# Patient Record
Sex: Female | Born: 1940
Health system: Southern US, Community
[De-identification: ages and names within clinical notes are randomized; demographics above are authoritative.]

## PROBLEM LIST (undated history)

## (undated) DIAGNOSIS — F419 Anxiety disorder, unspecified: Secondary | ICD-10-CM

## (undated) DIAGNOSIS — E871 Hypo-osmolality and hyponatremia: Secondary | ICD-10-CM

## (undated) DIAGNOSIS — F329 Major depressive disorder, single episode, unspecified: Secondary | ICD-10-CM

## (undated) DIAGNOSIS — J449 Chronic obstructive pulmonary disease, unspecified: Secondary | ICD-10-CM

## (undated) DIAGNOSIS — K219 Gastro-esophageal reflux disease without esophagitis: Secondary | ICD-10-CM

## (undated) DIAGNOSIS — I509 Heart failure, unspecified: Secondary | ICD-10-CM

## (undated) DIAGNOSIS — I2510 Atherosclerotic heart disease of native coronary artery without angina pectoris: Secondary | ICD-10-CM

## (undated) DIAGNOSIS — I10 Essential (primary) hypertension: Secondary | ICD-10-CM

## (undated) DIAGNOSIS — D72825 Bandemia: Secondary | ICD-10-CM

## (undated) DIAGNOSIS — F32A Depression, unspecified: Secondary | ICD-10-CM

## (undated) DIAGNOSIS — M1990 Unspecified osteoarthritis, unspecified site: Secondary | ICD-10-CM

## (undated) DIAGNOSIS — I4891 Unspecified atrial fibrillation: Secondary | ICD-10-CM

## (undated) HISTORY — PX: BREAST SURGERY: SHX581

## (undated) HISTORY — PX: CARDIAC ELECTROPHYSIOLOGY MAPPING AND ABLATION: SHX1292

## (undated) HISTORY — PX: FRACTURE SURGERY: SHX138

---

## 2014-02-03 DIAGNOSIS — I10 Essential (primary) hypertension: Secondary | ICD-10-CM | POA: Insufficient documentation

## 2014-02-03 DIAGNOSIS — I341 Nonrheumatic mitral (valve) prolapse: Secondary | ICD-10-CM | POA: Insufficient documentation

## 2015-05-02 DIAGNOSIS — J449 Chronic obstructive pulmonary disease, unspecified: Secondary | ICD-10-CM | POA: Insufficient documentation

## 2016-07-14 DIAGNOSIS — J449 Chronic obstructive pulmonary disease, unspecified: Secondary | ICD-10-CM | POA: Insufficient documentation

## 2017-01-25 DIAGNOSIS — I4892 Unspecified atrial flutter: Secondary | ICD-10-CM

## 2017-01-25 DIAGNOSIS — I4891 Unspecified atrial fibrillation: Secondary | ICD-10-CM | POA: Insufficient documentation

## 2017-02-08 ENCOUNTER — Emergency Department (HOSPITAL_BASED_OUTPATIENT_CLINIC_OR_DEPARTMENT_OTHER)
Admission: EM | Admit: 2017-02-08 | Discharge: 2017-02-08 | Disposition: A | Discharge status: Home / Self Care | Payer: Medicare Other | Attending: Emergency Medicine | Admitting: Emergency Medicine

## 2017-02-08 ENCOUNTER — Emergency Department (HOSPITAL_BASED_OUTPATIENT_CLINIC_OR_DEPARTMENT_OTHER): Payer: Medicare Other

## 2017-02-08 ENCOUNTER — Encounter (HOSPITAL_BASED_OUTPATIENT_CLINIC_OR_DEPARTMENT_OTHER): Payer: Self-pay | Admitting: *Deleted

## 2017-02-08 DIAGNOSIS — Z79899 Other long term (current) drug therapy: Secondary | ICD-10-CM | POA: Insufficient documentation

## 2017-02-08 DIAGNOSIS — Z87891 Personal history of nicotine dependence: Secondary | ICD-10-CM | POA: Insufficient documentation

## 2017-02-08 DIAGNOSIS — R0602 Shortness of breath: Secondary | ICD-10-CM | POA: Diagnosis not present

## 2017-02-08 DIAGNOSIS — Z7901 Long term (current) use of anticoagulants: Secondary | ICD-10-CM | POA: Diagnosis not present

## 2017-02-08 DIAGNOSIS — J90 Pleural effusion, not elsewhere classified: Secondary | ICD-10-CM | POA: Diagnosis not present

## 2017-02-08 DIAGNOSIS — J449 Chronic obstructive pulmonary disease, unspecified: Secondary | ICD-10-CM | POA: Diagnosis not present

## 2017-02-08 DIAGNOSIS — I110 Hypertensive heart disease with heart failure: Secondary | ICD-10-CM | POA: Diagnosis not present

## 2017-02-08 DIAGNOSIS — I509 Heart failure, unspecified: Secondary | ICD-10-CM | POA: Insufficient documentation

## 2017-02-08 HISTORY — DX: Major depressive disorder, single episode, unspecified: F32.9

## 2017-02-08 HISTORY — DX: Hypo-osmolality and hyponatremia: E87.1

## 2017-02-08 HISTORY — DX: Heart failure, unspecified: I50.9

## 2017-02-08 HISTORY — DX: Essential (primary) hypertension: I10

## 2017-02-08 HISTORY — DX: Depression, unspecified: F32.A

## 2017-02-08 HISTORY — DX: Gastro-esophageal reflux disease without esophagitis: K21.9

## 2017-02-08 HISTORY — DX: Chronic obstructive pulmonary disease, unspecified: J44.9

## 2017-02-08 HISTORY — DX: Bandemia: D72.825

## 2017-02-08 HISTORY — DX: Unspecified osteoarthritis, unspecified site: M19.90

## 2017-02-08 HISTORY — DX: Atherosclerotic heart disease of native coronary artery without angina pectoris: I25.10

## 2017-02-08 HISTORY — DX: Anxiety disorder, unspecified: F41.9

## 2017-02-08 HISTORY — DX: Unspecified atrial fibrillation: I48.91

## 2017-02-08 LAB — BASIC METABOLIC PANEL
Anion gap: 9 (ref 5–15)
BUN: 11 mg/dL (ref 6–20)
CALCIUM: 9.3 mg/dL (ref 8.9–10.3)
CHLORIDE: 97 mmol/L — AB (ref 101–111)
CO2: 24 mmol/L (ref 22–32)
CREATININE: 0.82 mg/dL (ref 0.44–1.00)
GFR calc non Af Amer: >60 mL/min (ref >60)
Glucose, Bld: 117 mg/dL — ABNORMAL HIGH (ref 65–99)
Potassium: 3.5 mmol/L (ref 3.5–5.1)
Sodium: 130 mmol/L — ABNORMAL LOW (ref 135–145)

## 2017-02-08 LAB — CBC
HCT: 38.5 % (ref 36.0–46.0)
Hemoglobin: 12.8 g/dL (ref 12.0–15.0)
MCH: 31.1 pg (ref 26.0–34.0)
MCHC: 33.2 g/dL (ref 30.0–36.0)
MCV: 93.7 fL (ref 78.0–100.0)
PLATELETS: 398 10*3/uL (ref 150–400)
RBC: 4.11 MIL/uL (ref 3.87–5.11)
RDW: 13.2 % (ref 11.5–15.5)
WBC: 14.2 10*3/uL — AB (ref 4.0–10.5)

## 2017-02-08 LAB — URINALYSIS, ROUTINE W REFLEX MICROSCOPIC
Glucose, UA: NEGATIVE mg/dL
HGB URINE DIPSTICK: NEGATIVE
KETONES UR: 15 mg/dL — AB
Leukocytes, UA: NEGATIVE
Nitrite: NEGATIVE
PH: 6 (ref 5.0–8.0)
Protein, ur: NEGATIVE mg/dL
SPECIFIC GRAVITY, URINE: 1.025 (ref 1.005–1.030)

## 2017-02-08 LAB — TROPONIN I
Troponin I: 0.06 ng/mL (ref <0.03)
Troponin I: 0.05 ng/mL (ref <0.03)

## 2017-02-08 LAB — BRAIN NATRIURETIC PEPTIDE: B NATRIURETIC PEPTIDE 5: 231.1 pg/mL — AB (ref 0.0–100.0)

## 2017-02-08 MED ORDER — FUROSEMIDE 20 MG PO TABS: 20.0000 mg | ORAL_TABLET | Freq: Once | ORAL | 0 refills | Status: DC

## 2017-02-08 MED ORDER — IPRATROPIUM-ALBUTEROL 0.5-2.5 (3) MG/3ML IN SOLN
3.0000 mL | Freq: Four times a day (QID) | RESPIRATORY_TRACT | Status: DC
  Administered 2017-02-08: 3 mL via RESPIRATORY_TRACT

## 2017-02-08 MED ORDER — SODIUM BICARBONATE 325 MG PO TABS: 325.0000 mg | ORAL_TABLET | Freq: Two times a day (BID) | ORAL | 0 refills | Status: AC

## 2017-02-08 MED ORDER — IPRATROPIUM-ALBUTEROL 0.5-2.5 (3) MG/3ML IN SOLN
RESPIRATORY_TRACT | Status: AC
  Administered 2017-02-08: 3 mL via RESPIRATORY_TRACT
  Filled 2017-02-08: qty 3

## 2017-02-08 MED ORDER — POTASSIUM CHLORIDE ER 10 MEQ PO TBCR: 20.0000 meq | EXTENDED_RELEASE_TABLET | Freq: Two times a day (BID) | ORAL | 0 refills | Status: DC

## 2017-02-08 MED ORDER — IOPAMIDOL (ISOVUE-370) INJECTION 76%
100.0000 mL | Freq: Once | INTRAVENOUS | Status: AC | PRN
  Administered 2017-02-08: 100 mL via INTRAVENOUS

## 2017-02-08 MED ORDER — HYDROXYZINE HCL 25 MG PO TABS: 25.0000 mg | ORAL_TABLET | Freq: Four times a day (QID) | ORAL | 0 refills | Status: AC | PRN

## 2017-02-08 MED ORDER — FUROSEMIDE 20 MG PO TABS
20.0000 mg | ORAL_TABLET | Freq: Once | ORAL | Status: AC
  Administered 2017-02-08: 20 mg via ORAL
  Filled 2017-02-08: qty 1

## 2017-02-08 MED ORDER — DEXAMETHASONE 6 MG PO TABS
10.0000 mg | ORAL_TABLET | Freq: Once | ORAL | Status: AC
  Administered 2017-02-08: 10 mg via ORAL
  Filled 2017-02-08: qty 1

## 2017-02-08 MED FILL — hydrOXYzine HCL 25 MG TABS: 25 | 5 days supply | Qty: 20 | Fill #0

## 2017-02-08 MED FILL — POTASSIUM CL ER 10 MEQ TAB: 10 | 1 days supply | Qty: 4 | Fill #0

## 2017-02-08 MED FILL — FUROSEMIDE 20 MG TAB: 20 | 1 days supply | Qty: 1 | Fill #0

## 2017-02-08 NOTE — ED Notes (Signed)
Date and time results received: 02/08/17 3:42 PM   Test: Troponin Critical Value: 0.05  Name of Provider Notified: Schlossman  Orders Received? Or Actions Taken?: Actions Taken: MD notified

## 2017-02-08 NOTE — ED Notes (Signed)
ED Provider at bedside discussing test results and plan of care. 

## 2017-02-08 NOTE — ED Triage Notes (Signed)
Pt states SOB x 4 days, recent admit to [facility] for same , discharge x 1 day ago

## 2017-02-08 NOTE — ED Notes (Signed)
Patient transported to X-ray 

## 2017-02-08 NOTE — ED Notes (Signed)
Back from X-ray - no changes to condition at this time

## 2017-02-08 NOTE — ED Provider Notes (Signed)
[facility] EMERGENCY DEPARTMENT Provider Note   CSN: [id number] Arrival date & time: 02/08/17  1238     History   Chief Complaint Chief Complaint  Patient presents with  . Shortness of Breath    HPI Nicole Kerr is a 76 y.o. female.  HPI  Coming in today for shortness of breath Reports having afib, high rate, development of dyspnea prior to ablation After ablation felt well, had 4 days of being in rhythm and then went back into atrial flutter again  Reports had dyspnea since April or May prior to the ablation, but was not like this  Reports dyspnea worsened after she went back into afib after the ablation, has been about the same as it was during the hospitalization.  Reports now the shortness of breath makes her feel wiped out.  Reports generalized weakness Has had cough from irritated larynx and sore throat from intubation for ablation  NO fevers, no chest pain, nausea, vomiting, diarrhea (had diarrhea previously now improved) Nonproductive cough, hacking cough  Nothing makes dyspnea better, when more active shortness of breath is worse, not really wheezing, dyspnea worse laying down flat, no leg swelling or pain  Just left hospital Saturday, saw Cardiologist while in the hospital  Past Medical History:  Diagnosis Date  . A-fib (HCC)   . Anxiety   . Arthritis   . Bandemia   . CHF (congestive heart failure) (HCC)   . COPD (chronic obstructive pulmonary disease) (HCC)   . Coronary artery disease   . Depression   . GERD (gastroesophageal reflux disease)   . Hypertension   . Hyponatremia     There are no active problems to display for this patient.   Past Surgical History:  Procedure Laterality Date  . BREAST SURGERY    . CARDIAC ELECTROPHYSIOLOGY MAPPING AND ABLATION    . FRACTURE SURGERY      OB History    No data available       Home Medications    Prior to Admission medications   Medication Sig Start Date End Date Taking?  Authorizing Provider  amiodarone (PACERONE) 200 MG tablet Take 200 mg by mouth 2 (two) times daily.   Yes [provider]  apixaban (ELIQUIS) 5 MG TABS tablet Take 5 mg by mouth 2 (two) times daily.   Yes [provider]  diltiazem (CARDIZEM CD) 240 MG 24 hr capsule Take 240 mg by mouth daily.   Yes [provider]  fexofenadine (ALLEGRA) 180 MG tablet Take 180 mg by mouth daily.   Yes [provider]  LORazepam (ATIVAN) 2 MG tablet Take 2 mg by mouth every evening.   Yes [provider]  metoprolol tartrate (LOPRESSOR) 25 MG tablet Take 12.5 mg by mouth 2 (two) times daily.   Yes [provider]  sucralfate (CARAFATE) 1 g tablet Take 1 g by mouth 4 (four) times daily.   Yes [provider]  valACYclovir (VALTREX) 500 MG tablet Take 500 mg by mouth daily.   Yes [provider]  venlafaxine (EFFEXOR) 75 MG tablet Take 75 mg by mouth 2 (two) times daily.   Yes [provider]  furosemide (LASIX) 20 MG tablet Take 1 tablet (20 mg total) by mouth once. 02/09/17 02/09/17  Alvira MondaySchlossman, Deklynn Charlet, MD  hydrOXYzine (ATARAX/VISTARIL) 25 MG tablet Take 1-2 tablets (25-50 mg total) by mouth every 6 (six) hours as needed for anxiety. 02/08/17   Alvira MondaySchlossman, Lonnetta Kniskern, MD  potassium chloride (K-DUR) 10 MEQ tablet  Take 2 tablets (20 mEq total) by mouth 2 (two) times daily. 02/08/17 02/09/17  Alvira Monday, MD  sodium bicarbonate 325 MG tablet Take 1 tablet (325 mg total) by mouth 2 (two) times daily. 02/08/17 02/11/17  Alvira Monday, MD    Family History History reviewed. No pertinent family history.  Social History Social History  Substance Use Topics  . Smoking status: Former Games developer  . Smokeless tobacco: Never Used  . Alcohol use No     Allergies   Bentyl [dicyclomine hcl]; Celebrex [celecoxib]; Fluoxetine; Motrin [ibuprofen]; Phenobarbital; Prednisone; Sulfa antibiotics; and Tizanidine   Review of Systems Review of Systems   Constitutional: Positive for fatigue. Negative for fever.  HENT: Positive for sore throat.   Eyes: Negative for visual disturbance.  Respiratory: Positive for cough and shortness of breath.   Cardiovascular: Negative for chest pain and leg swelling.  Gastrointestinal: Negative for abdominal pain, nausea and vomiting.  Genitourinary: Negative for difficulty urinating and dysuria.  Musculoskeletal: Negative for back pain and neck pain.  Skin: Negative for rash.  Neurological: Negative for syncope and headaches.     Physical Exam Updated Vital Signs BP (!) 147/89 (BP Location: Left Arm)   Pulse 96   Temp 97.9 F (36.6 C) (Oral)   Resp 20   Ht 5' (1.524 m)   Wt 60.3 kg (133 lb)   SpO2 94%   BMI 25.97 kg/m   Physical Exam  Constitutional: She is oriented to person, place, and time. She appears well-developed and well-nourished. No distress.  HENT:  Head: Normocephalic and atraumatic.  Eyes: Conjunctivae and EOM are normal.  Neck: Normal range of motion. No JVD present.  Cardiovascular: Normal rate, normal heart sounds and intact distal pulses.  An irregularly irregular rhythm present. Exam reveals no gallop and no friction rub.   No murmur heard. Pulmonary/Chest: Effort normal. No respiratory distress. She has no wheezes. She has no rales.  Diminished breath sounds bilaterally in all fields, more diminished bilateral bases  Abdominal: Soft. She exhibits no distension. There is no tenderness. There is no guarding.  Musculoskeletal: She exhibits edema (1+ bilaterally). She exhibits no tenderness.  Neurological: She is alert and oriented to person, place, and time.  Skin: Skin is warm and dry. No rash noted. She is not diaphoretic. No erythema.  Nursing note and vitals reviewed.    ED Treatments / Results  Labs (all labs ordered are listed, but only abnormal results are displayed) Labs Reviewed  BASIC METABOLIC PANEL - Abnormal; Notable for the following:       Result Value    Sodium 130 (*)    Chloride 97 (*)    Glucose, Bld 117 (*)    All other components within normal limits  CBC - Abnormal; Notable for the following:    WBC 14.2 (*)    All other components within normal limits  BRAIN NATRIURETIC PEPTIDE - Abnormal; Notable for the following:    B Natriuretic Peptide 231.1 (*)    All other components within normal limits  TROPONIN I - Abnormal; Notable for the following:    Troponin I 0.06 (*)    All other components within normal limits  URINALYSIS, ROUTINE W REFLEX MICROSCOPIC - Abnormal; Notable for the following:    Bilirubin Urine SMALL (*)    Ketones, ur 15 (*)    All other components within normal limits  TROPONIN I - Abnormal; Notable for the following:    Troponin I 0.05 (*)    All other  components within normal limits  URINE CULTURE    EKG  EKG Interpretation  Date/Time:  Monday February 08 2017 12:51:24 EDT Ventricular Rate:  104 PR Interval:    QRS Duration: 61 QT Interval:  442 QTC Calculation: 582 R Axis:   54 Text Interpretation:  Atrial fibrillation Low voltage, precordial leads Nonspecific T abnormalities, lateral leads Prolonged QT interval No previous ECGs available Confirmed by Alvira Monday ([id number]) on 02/08/2017 1:43:10 PM       Radiology Dg Chest 2 View  Result Date: 02/08/2017 CLINICAL DATA:  Shortness of breath since cardiac ablation procedure 14 days ago. Former smoker. EXAM: CHEST  2 VIEW COMPARISON:  None in PACs FINDINGS: The lungs are adequately inflated. There are small bilateral pleural effusions. There is no alveolar infiltrate or pneumothorax. The cardiac silhouette is top-normal in size. The pulmonary vascularity is normal. There is calcification in the wall of the aortic arch. The bony thorax is unremarkable. IMPRESSION: Small bilateral pleural effusions.  No pulmonary edema. Thoracic aortic atherosclerosis. Electronically Signed   By: David  [place] M.D.   On: 02/08/2017 13:16   Ct Angio Chest Pe W  And/or Wo Contrast  Result Date: 02/08/2017 CLINICAL DATA:  Increase shortness of breath, atrial fibrillation. EXAM: CT ANGIOGRAPHY CHEST WITH CONTRAST TECHNIQUE: Multidetector CT imaging of the chest was performed using the standard protocol during bolus administration of intravenous contrast. Multiplanar CT image reconstructions and MIPs were obtained to evaluate the vascular anatomy. CONTRAST:  100 cc Isovue 370. COMPARISON:  None. FINDINGS: Cardiovascular: Negative for pulmonary embolus. Atherosclerotic calcification of the arterial vasculature, including coronary arteries. Heart is enlarged. Mild to moderate pericardial effusion. Mediastinum/Nodes: Mediastinal lymph nodes are not enlarged by CT size criteria. No hilar or axillary adenopathy. Esophagus is grossly unremarkable. Lungs/Pleura: Biapical pleuroparenchymal scarring. Mild volume loss or scarring in the right middle lobe and lingula. Mild compressive atelectasis in both lower lobes, adjacent to moderate bilateral pleural effusions, which are simple in appearance. Airway is unremarkable. Upper Abdomen: Reflux of contrast into the IVC and hepatic veins. Visualized portions of the liver, adrenal glands, left kidney, spleen, pancreas and stomach are otherwise grossly unremarkable. Musculoskeletal: No worrisome lytic or sclerotic lesions. Degenerative changes in the spine. Review of the MIP images confirms the above findings. IMPRESSION: 1. Negative for pulmonary embolus. 2. Small to moderate pericardial effusion. 3. Moderate bilateral pleural effusions with mild compressive atelectasis in both lower lobes. 4. Aortic atherosclerosis (ICD10-170.0). Coronary artery calcification. Electronically Signed   By: Leanna Battles M.D.   On: 02/08/2017 14:39    Procedures Procedures (including critical care time)  Medications Ordered in ED Medications  iopamidol (ISOVUE-370) 76 % injection 100 mL (100 mLs Intravenous Contrast Given 02/08/17 1423)    furosemide (LASIX) tablet 20 mg (20 mg Oral Given 02/08/17 1621)  dexamethasone (DECADRON) tablet 10 mg (10 mg Oral Given 02/08/17 1622)     Initial Impression / Assessment and Plan / ED Course  I have reviewed the triage vital signs and the nursing notes.  Pertinent labs & imaging results that were available during my care of the patient were reviewed by me and considered in my medical decision making (see chart for details).     76 year old female with a history of atrial fibrillation/atrial flutter status post radiofrequency ablation on 01/25/2017, COPD, CAD, htn, hlpd who presents after recent hospitalization for shortness of breath with discharge 2 days ago, with continuing shortness of breath and generalized weakness.  EKG shows atrial fibrillation without other significant abnormalities.  Chest x-ray shows pleural effusions which have been seen on recent hospitalization.    Given recent hospitalization, continuing shortness of breath and fatigue, ordered CT PE study.  CT shows no evidence of pulmonary embolus, no pneumonia.  Does show bilateral moderate effusions and small pericardial effusion which was also noted during recent hospitalization.  Patient is lasix naive, will give small dose of lasix here given effusions, peripheral edema, and 20mg  to take tomorrow and recommend close follow with Cardiology for further instructions.  Patient does have a history of COPD, with diminished lung sounds throughout, and some of her cough and shortness of breath may be worsened by COPD.  Suspect primarily her symptoms are secondary to her pleural effusions, but overlying COPD is possible.  Provided patient with albuterol-ipratroprium after which she reports some improvement in symptoms.  Given some improvement with this, gave dose of decadron, recommend prn albuterol but also cautioned regarding risk of tachycardia.     She reports she was unable to fill her sodium bicarbonate from the hospital and  was provided with rx for this to complete treatment.  She was also given K rx given K 3.5 and providing pt lasix for today and tomorrow.  She does report anxiety at home and was given prn vistaril and recommend PCP follow up for other titration.  She has normal oxygenation, afib rate controlled, is in no acute distress and continued outpatient follow up and evaluation regarding her dyspnea (likely multifactorial from effusions, COPD) and generalized weakness, also likely multifactorial secondary to above and deconditioning after hospitalization.  States understanding.  Patient discharged in stable condition with understanding of reasons to return.      Final Clinical Impressions(s) / ED Diagnoses   Final diagnoses:  Shortness of breath  Pleural effusion  Chronic obstructive pulmonary disease, unspecified COPD type (HCC)    New Prescriptions Discharge Medication List as of 02/08/2017  4:23 PM    START taking these medications   Details  furosemide (LASIX) 20 MG tablet Take 1 tablet (20 mg total) by mouth once., Starting Tue 02/09/2017, Print    hydrOXYzine (ATARAX/VISTARIL) 25 MG tablet Take 1-2 tablets (25-50 mg total) by mouth every 6 (six) hours as needed for anxiety., Starting Mon 02/08/2017, Print    potassium chloride (K-DUR) 10 MEQ tablet Take 2 tablets (20 mEq total) by mouth 2 (two) times daily., Starting Mon 02/08/2017, Until Tue 02/09/2017, Print    sodium bicarbonate 325 MG tablet Take 1 tablet (325 mg total) by mouth 2 (two) times daily., Starting Mon 02/08/2017, Until Thu 02/11/2017, Print         Alvira Monday, MD 02/08/17 2217

## 2017-02-08 NOTE — ED Notes (Signed)
ED Provider at bedside. 

## 2017-02-10 LAB — URINE CULTURE

## 2017-02-13 DIAGNOSIS — J90 Pleural effusion, not elsewhere classified: Secondary | ICD-10-CM | POA: Insufficient documentation

## 2017-03-29 ENCOUNTER — Emergency Department (HOSPITAL_BASED_OUTPATIENT_CLINIC_OR_DEPARTMENT_OTHER): Payer: Medicare Other

## 2017-03-29 ENCOUNTER — Encounter (HOSPITAL_BASED_OUTPATIENT_CLINIC_OR_DEPARTMENT_OTHER): Payer: Self-pay | Admitting: *Deleted

## 2017-03-29 ENCOUNTER — Other Ambulatory Visit: Payer: Self-pay

## 2017-03-29 ENCOUNTER — Emergency Department (HOSPITAL_BASED_OUTPATIENT_CLINIC_OR_DEPARTMENT_OTHER)
Admission: EM | Admit: 2017-03-29 | Discharge: 2017-03-29 | Disposition: A | Discharge status: Home / Self Care | Payer: Medicare Other | Attending: Emergency Medicine | Admitting: Emergency Medicine

## 2017-03-29 DIAGNOSIS — J449 Chronic obstructive pulmonary disease, unspecified: Secondary | ICD-10-CM | POA: Diagnosis not present

## 2017-03-29 DIAGNOSIS — Z79899 Other long term (current) drug therapy: Secondary | ICD-10-CM | POA: Diagnosis not present

## 2017-03-29 DIAGNOSIS — R0602 Shortness of breath: Secondary | ICD-10-CM | POA: Insufficient documentation

## 2017-03-29 DIAGNOSIS — I110 Hypertensive heart disease with heart failure: Secondary | ICD-10-CM | POA: Diagnosis not present

## 2017-03-29 DIAGNOSIS — I4891 Unspecified atrial fibrillation: Secondary | ICD-10-CM | POA: Diagnosis not present

## 2017-03-29 DIAGNOSIS — Z7901 Long term (current) use of anticoagulants: Secondary | ICD-10-CM | POA: Diagnosis not present

## 2017-03-29 DIAGNOSIS — Z87891 Personal history of nicotine dependence: Secondary | ICD-10-CM | POA: Insufficient documentation

## 2017-03-29 DIAGNOSIS — I2510 Atherosclerotic heart disease of native coronary artery without angina pectoris: Secondary | ICD-10-CM | POA: Diagnosis not present

## 2017-03-29 DIAGNOSIS — I509 Heart failure, unspecified: Secondary | ICD-10-CM | POA: Diagnosis not present

## 2017-03-29 LAB — URINALYSIS, ROUTINE W REFLEX MICROSCOPIC
Bilirubin Urine: NEGATIVE
GLUCOSE, UA: NEGATIVE mg/dL
Hgb urine dipstick: NEGATIVE
Ketones, ur: NEGATIVE mg/dL
LEUKOCYTES UA: NEGATIVE
NITRITE: NEGATIVE
Protein, ur: NEGATIVE mg/dL
Specific Gravity, Urine: <1.005 — ABNORMAL LOW (ref 1.005–1.030)
pH: 6.5 (ref 5.0–8.0)

## 2017-03-29 LAB — BASIC METABOLIC PANEL
Anion gap: 9 (ref 5–15)
BUN: 14 mg/dL (ref 6–20)
CALCIUM: 8.9 mg/dL (ref 8.9–10.3)
CO2: 26 mmol/L (ref 22–32)
Chloride: 97 mmol/L — ABNORMAL LOW (ref 101–111)
Creatinine, Ser: 1.18 mg/dL — ABNORMAL HIGH (ref 0.44–1.00)
GFR calc Af Amer: 50 mL/min — ABNORMAL LOW (ref >60)
GFR, EST NON AFRICAN AMERICAN: 43 mL/min — AB (ref >60)
GLUCOSE: 137 mg/dL — AB (ref 65–99)
Potassium: 3.8 mmol/L (ref 3.5–5.1)
SODIUM: 132 mmol/L — AB (ref 135–145)

## 2017-03-29 LAB — CBC
HCT: 36.7 % (ref 36.0–46.0)
Hemoglobin: 12.4 g/dL (ref 12.0–15.0)
MCH: 29 pg (ref 26.0–34.0)
MCHC: 33.8 g/dL (ref 30.0–36.0)
MCV: 85.7 fL (ref 78.0–100.0)
Platelets: 371 10*3/uL (ref 150–400)
RBC: 4.28 MIL/uL (ref 3.87–5.11)
RDW: 15.8 % — AB (ref 11.5–15.5)
WBC: 14.3 10*3/uL — AB (ref 4.0–10.5)

## 2017-03-29 LAB — HEPATIC FUNCTION PANEL
ALT: 23 U/L (ref 14–54)
AST: 26 U/L (ref 15–41)
Albumin: 2.8 g/dL — ABNORMAL LOW (ref 3.5–5.0)
Alkaline Phosphatase: 104 U/L (ref 38–126)
BILIRUBIN INDIRECT: 0.5 mg/dL (ref 0.3–0.9)
Bilirubin, Direct: 0.1 mg/dL (ref 0.1–0.5)
TOTAL PROTEIN: 7.1 g/dL (ref 6.5–8.1)
Total Bilirubin: 0.6 mg/dL (ref 0.3–1.2)

## 2017-03-29 LAB — LIPASE, BLOOD: LIPASE: 28 U/L (ref 11–51)

## 2017-03-29 LAB — BRAIN NATRIURETIC PEPTIDE: B NATRIURETIC PEPTIDE 5: 410.7 pg/mL — AB (ref 0.0–100.0)

## 2017-03-29 NOTE — ED Triage Notes (Signed)
Hx of afib. [facility] was just [facility] with [facility]. She was given an incentive spirometer. She is coughing and SOB. She did not go to [facility] today because her family feels she needs to change doctors.

## 2017-03-29 NOTE — ED Notes (Signed)
PT ambulated with pulse ox, Sats remained 96% to 100% with a max heart rate of 98. PT reports she feels SOB with minimal exertion.

## 2017-03-29 NOTE — Discharge Instructions (Signed)
Please call to schedule a follow-up appointment with cardiology for further management of your atrial fibrillation and shortness of breath.  Your workup today was reassuring.  If any symptoms change or worsen, please return to the nearest emergency department.

## 2017-03-29 NOTE — ED Provider Notes (Signed)
[facility] EMERGENCY DEPARTMENT Provider Note   CSN: [id number] Arrival date & time: 03/29/17  1109     History   Chief Complaint Chief Complaint  Patient presents with  . Shortness of Breath    HPI Nicole Kerr is a 76 y.o. female.    The history is provided by the patient, a relative and medical records. No language interpreter was used.  Shortness of Breath  This is a recurrent problem. The average episode lasts 4 days. The problem occurs continuously.The current episode started more than 2 days ago. The problem has not changed since onset.Associated symptoms include leg swelling (unchabnged). Pertinent negatives include no fever, no rhinorrhea, no neck pain, no cough, no sputum production, no wheezing, no chest pain, no syncope, no vomiting, no rash and no leg pain. She has tried nothing for the symptoms. The treatment provided no relief. Associated medical issues include CAD and heart failure.    Past Medical History:  Diagnosis Date  . A-fib (HCC)   . Anxiety   . Arthritis   . Bandemia   . CHF (congestive heart failure) (HCC)   . COPD (chronic obstructive pulmonary disease) (HCC)   . Coronary artery disease   . Depression   . GERD (gastroesophageal reflux disease)   . Hypertension   . Hyponatremia     There are no active problems to display for this patient.   Past Surgical History:  Procedure Laterality Date  . BREAST SURGERY    . CARDIAC ELECTROPHYSIOLOGY MAPPING AND ABLATION    . FRACTURE SURGERY      OB History    No data available       Home Medications    Prior to Admission medications   Medication Sig Start Date End Date Taking? Authorizing Provider  amiodarone (PACERONE) 200 MG tablet Take 200 mg by mouth 2 (two) times daily.    [provider]  apixaban (ELIQUIS) 5 MG TABS tablet Take 5 mg by mouth 2 (two) times daily.    [provider]  diltiazem (CARDIZEM CD) 240 MG 24 hr capsule Take 240 mg by mouth  daily.    [provider]  fexofenadine (ALLEGRA) 180 MG tablet Take 180 mg by mouth daily.    [provider]  furosemide (LASIX) 20 MG tablet Take 1 tablet (20 mg total) by mouth once. 02/09/17 02/09/17  Alvira MondaySchlossman, Erin, MD  hydrOXYzine (ATARAX/VISTARIL) 25 MG tablet Take 1-2 tablets (25-50 mg total) by mouth every 6 (six) hours as needed for anxiety. 02/08/17   Alvira MondaySchlossman, Erin, MD  LORazepam (ATIVAN) 2 MG tablet Take 2 mg by mouth every evening.    [provider]  metoprolol tartrate (LOPRESSOR) 25 MG tablet Take 12.5 mg by mouth 2 (two) times daily.    [provider]  potassium chloride (K-DUR) 10 MEQ tablet Take 2 tablets (20 mEq total) by mouth 2 (two) times daily. 02/08/17 02/09/17  Alvira MondaySchlossman, Erin, MD  sucralfate (CARAFATE) 1 g tablet Take 1 g by mouth 4 (four) times daily.    [provider]  valACYclovir (VALTREX) 500 MG tablet Take 500 mg by mouth daily.    [provider]  venlafaxine (EFFEXOR) 75 MG tablet Take 75 mg by mouth 2 (two) times daily.    [provider]    Family History No family history on file.  Social History Social History   Tobacco Use  . Smoking status: Former Games developermoker  . Smokeless tobacco: Never Used  Substance Use Topics  .  Alcohol use: No  . Drug use: No     Allergies   Bentyl [dicyclomine hcl]; Celebrex [celecoxib]; Fluoxetine; Motrin [ibuprofen]; Phenobarbital; Prednisone; Sulfa antibiotics; and Tizanidine   Review of Systems Review of Systems  Constitutional: Negative for chills, diaphoresis, fatigue and fever.  HENT: Negative for congestion and rhinorrhea.   Respiratory: Positive for shortness of breath. Negative for cough, sputum production, choking, chest tightness and wheezing.   Cardiovascular: Positive for leg swelling (unchabnged). Negative for chest pain and syncope.  Gastrointestinal: Negative for constipation, diarrhea, nausea and vomiting.  Genitourinary: Negative  for dysuria.  Musculoskeletal: Negative for back pain, neck pain and neck stiffness.  Skin: Negative for rash.  Neurological: Negative for light-headedness and numbness.  Psychiatric/Behavioral: Negative for agitation and confusion.  All other systems reviewed and are negative.    Physical Exam Updated Vital Signs BP 118/69 (BP Location: Left Arm)   Pulse 84   Temp 97.7 F (36.5 C) (Oral)   Resp 20   Ht 5' (1.524 m)   Wt 57.2 kg (126 lb)   SpO2 99%   BMI 24.61 kg/m   Physical Exam  Constitutional: She appears well-developed and well-nourished. No distress.  HENT:  Head: Normocephalic.  Mouth/Throat: Oropharynx is clear and moist. No oropharyngeal exudate.  Eyes: Conjunctivae and EOM are normal. Pupils are equal, round, and reactive to light.  Neck: Normal range of motion.  Cardiovascular: Normal rate and intact distal pulses.  No murmur heard. Pulmonary/Chest: Effort normal. No stridor. No respiratory distress. She has no wheezes. She has rales. She exhibits no tenderness.  Abdominal: Soft. Bowel sounds are normal. There is no tenderness. There is no guarding.  Musculoskeletal: She exhibits edema. She exhibits no tenderness.  Neurological: She is alert. No sensory deficit. She exhibits normal muscle tone.  Skin: Capillary refill takes less than 2 seconds. No rash noted. She is not diaphoretic. No erythema.  Psychiatric: She has a normal mood and affect.  Nursing note and vitals reviewed.    ED Treatments / Results  Labs (all labs ordered are listed, but only abnormal results are displayed) Labs Reviewed  BASIC METABOLIC PANEL - Abnormal; Notable for the following components:      Result Value   Sodium 132 (*)    Chloride 97 (*)    Glucose, Bld 137 (*)    Creatinine, Ser 1.18 (*)    GFR calc non Af Amer 43 (*)    GFR calc Af Amer 50 (*)    All other components within normal limits  CBC - Abnormal; Notable for the following components:   WBC 14.3 (*)    RDW 15.8  (*)    All other components within normal limits  BRAIN NATRIURETIC PEPTIDE - Abnormal; Notable for the following components:   B Natriuretic Peptide 410.7 (*)    All other components within normal limits  HEPATIC FUNCTION PANEL - Abnormal; Notable for the following components:   Albumin 2.8 (*)    All other components within normal limits  URINALYSIS, ROUTINE W REFLEX MICROSCOPIC - Abnormal; Notable for the following components:   Color, Urine STRAW (*)    Specific Gravity, Urine <1.005 (*)    All other components within normal limits  URINE CULTURE  LIPASE, BLOOD    EKG  EKG Interpretation  Date/Time:  Monday March 29 2017 11:37:13 EST Ventricular Rate:  81 PR Interval:    QRS Duration: 88 QT Interval:  508 QTC Calculation: 590 R Axis:   47 Text Interpretation:  Atrial flutter with predominant 3:1 AV block Borderline repolarization abnormality Prolonged QT interval a flutter similar to prior.  No STEMI Confirmed by Theda Belfast ([id number]) on 03/29/2017 12:03:00 PM       Radiology Dg Chest 2 View  Result Date: 03/29/2017 CLINICAL DATA:  Coughing and shortness of breath. EXAM: CHEST  2 VIEW COMPARISON:  Chest x-ray dated March 19, 2017. FINDINGS: Stable mild cardiomegaly. Normal pulmonary vascularity. Stable small right pleural effusion. Interval decrease in size of now small left pleural effusion. Right greater than left basilar subsegmental atelectasis. No pneumothorax. No acute osseous abnormality. IMPRESSION: 1. Interval decrease in size of now small left pleural effusion. Stable small right pleural effusion. Electronically Signed   By: Obie Dredge M.D.   On: 03/29/2017 12:36    Procedures Procedures (including critical care time)  Medications Ordered in ED Medications - No data to display   Initial Impression / Assessment and Plan / ED Course  I have reviewed the triage vital signs and the nursing notes.  Pertinent labs & imaging results that were  available during my care of the patient were reviewed by me and considered in my medical decision making (see chart for details).     Fotini Lemus is a 76 y.o. female with a past medical history significant for CAD, atrial fibrillation on Eliquis, hypertension, GERD, CHF, and recurrent pleural effusions who presents with worsening fatigue, subjective fevers, chills, shortness of breath, and nausea.  Patient reports that she was discharged 1 week ago from [facility] for an admission for shortness of breath.  Patient reports having approximately 4 thoracentesis procedures performed for recurrent pleural effusions.  She reports that she was feeling better at discharge but then after several days the shortness of breath continued to worsen.  She says that she is unable to ambulate very far without feeling winded.  She denies chest pain but reports one episode over the last several days where she had palpitations and racing heart sensation.  She says that she is in A. fib and has had several cardioversions and ablation in the past.  She is scheduled to have a cardioversion performed later this week but she is concerned that her symptoms continue to worsen.  She reports that she has had a dry cough that has been persistent and had a subjective fever and chills over the last few days.  She reports nausea but no vomiting.  She reports she had a UTI during her recent admission but has not had further urinary symptoms.  She is feeling very tired and fatigued.  Patient and family are concerned about her continued and worsening symptoms.  On exam, patient has decreased breath sounds and crackling in the bases of her lungs bilaterally.  No significant wheezing.  Chest and abdomen are nontender.  Back is nontender no CVA tenderness.  Legs had mild edema bilaterally.  Patient was alert and oriented.  No focal neurologic deficits seen.  EKG was performed revealing atrial  fibrillation/flutter.  Given patient's recent admission, I am concerned about pneumonia versus fluid overload or recurrent pleural effusions causing her shortness of breath.  Patient will have repeat laboratory testing and imaging as well as urinalysis given the recent UTI and fatigue.  Anticipate reassessment after workup.  3:39 PM Diagnostic testing returns as seen above.  Creatinine slightly elevated from prior however I do not feel patient has a.  CBC showed mild leukocytosis that is unchanged from prior.  No anemia.  BNP slightly  elevated however chest x-ray did not show evidence of worsened pulmonary edema or enlarged pleural effusions.  Effusion is improved on the left and similar on the right.  Repeat EKG showed persistent a fib.   After diagnostic testing returned reassuring, patient was walked around the emergency department with a pulse oximeter on room air.  Patient was able to ambulate around the entire ED without assistance and without return of shortness of breath or hypoxia.  Patient maintained her oxygen saturation at 96% on room air.    Given patient's well appearance and maintaining her oxygen level, do not feel patient requires admission at this time.  Patient requested referral to [facility] cardiology for second opinion of her A. fib management.  Patient will be given this information.  Patient otherwise had no worsened symptoms and was felt to safe for discharge home.  Patient will follow with PCP and cardiology.  Patient discharged in good condition.   Final Clinical Impressions(s) / ED Diagnoses   Final diagnoses:  SOB (shortness of breath)  Atrial fibrillation, unspecified type [facility](HCC)    ED Discharge Orders    None     Clinical Impression: 1. SOB (shortness of breath)   2. Atrial fibrillation, unspecified type (HCC)     Disposition: Discharge  Condition: Good  I have discussed the results, Dx and Tx plan with the pt(& family if present). He/she/they expressed  understanding and agree(s) with the plan. Discharge instructions discussed at great length. Strict return precautions discussed and pt &/or family have verbalized understanding of the instructions. No further questions at time of discharge.    This SmartLink is deprecated. Use AVSMEDLIST instead to display the medication list for a patient.  Follow Up: [facility] [address] [place] [phone] Schedule an appointment as soon as possible for a visit    [facility] [address] [id number] mc [address] [place] [place] [phone]  If symptoms worsen     Tegeler, Canary Brimhristopher J, MD 03/29/17 [phone]

## 2017-03-29 NOTE — ED Notes (Signed)
Pt. Walked to Restroom with no difficulty.

## 2017-03-30 LAB — URINE CULTURE

## 2017-03-31 ENCOUNTER — Ambulatory Visit (INDEPENDENT_AMBULATORY_CARE_PROVIDER_SITE_OTHER): Payer: Medicare Other | Admitting: Cardiovascular Disease

## 2017-03-31 ENCOUNTER — Encounter: Payer: Self-pay | Admitting: Cardiovascular Disease

## 2017-03-31 VITALS — BP 130/84 | HR 91 | Ht 60.0 in | Wt 126.6 lb

## 2017-03-31 DIAGNOSIS — I4891 Unspecified atrial fibrillation: Secondary | ICD-10-CM | POA: Diagnosis not present

## 2017-03-31 DIAGNOSIS — I10 Essential (primary) hypertension: Secondary | ICD-10-CM | POA: Diagnosis not present

## 2017-03-31 NOTE — Patient Instructions (Signed)
Medication Instructions:  Your physician recommends that you continue on your current medications as directed. Please refer to the Current Medication list given to you today.  Labwork: NONE  Testing/Procedures: NONE  Follow-Up: AS NEEDED WITH DR [place]  You have been referred to ELECTROPHYSIOLOGY AT [facility] [address]

## 2017-03-31 NOTE — Progress Notes (Signed)
Cardiology Office Note   Date:  04/04/2017   ID:  Nicole Kerr, DOB 01/01/1941, MRN [id number]  PCP:  Oletha BlendWitten, Bobby D, MD  Cardiologist:  Dr. Sampson GoonFitzgerald [facility]  Chief Complaint  Patient presents with  . Shortness of Breath    pt states some SOB with activites       History of Present Illness: Nicole Kerr is a 76 y.o. female with hypertension, persistent atrial fibrillation post ablation, non-obstructive coronary artery disease, COPD, and recurrent pleural effusion who is being seen today for the evaluation of atrial fibrillation at the request of Oletha BlendWitten, Bobby D, MD.  Nicole Kerr history of atrial fibrillation that began 06/2016.  She initially presented with shortness of breath and palpitations.  She has tried multiple antiarrhythmics including flecainide, sotalol, and amiodarone.  She has also undergone recurrent cardioversions.  Each one lasts for period of 1-3 days. She had atrial fibrillation ablation on 01/25/17 with Dr. Sampson GoonFitzgerald at [facility].  She went back into atrial fibrillation one week after the procedure. She was on amiodarone prior to the ablation and has continued to take it since. She saw Elissa HeftyBrandy Younts-Davis, NP on 03/25/17 and was still in atrial fibrillation.  She was feeling fatigued and anxious.  Nicole Kerr arranged direct-current cardioversion on 12/21.  However she presents today for a second opinion.  Since her ablation Nicole Kerr has also required multiple thoracenteses.  Each time she reportedly had 400-900 mL of pleural fluid removed.  She reports that the cytology has been negative for malignancy.  She has been evaluated by a pulmonologist and thinks that her effusions are related to her ablation.   Prior to ablation she had a CT of the chest that revealed mild calcifications in the proximal and mid LAD.  She was also noted to have atherosclerosis of the thoracic aorta.  Transthoracic echo 02/12/17 revealed LVEF 55-60% with mild  tricuspid regurgitation and moderate left atrial enlargement.  She hasn't noted any chest pain or shortness of breath.  Her main complaint is fatigue that she attributes to the atrial fibrillation. She also wonders if her medication is making her tired.  She feels generally well in the morning when she first gets up and then gets tired after taking her medication.  She also reports nausea but denies chest pain or shortness of breath.   Past Medical History:  Diagnosis Date  . A-fib (HCC)   . Anxiety   . Arthritis   . Bandemia   . CHF (congestive heart failure) (HCC)   . COPD (chronic obstructive pulmonary disease) (HCC)   . Coronary artery disease   . Depression   . GERD (gastroesophageal reflux disease)   . Hypertension   . Hyponatremia     Past Surgical History:  Procedure Laterality Date  . BREAST SURGERY    . CARDIAC ELECTROPHYSIOLOGY MAPPING AND ABLATION    . FRACTURE SURGERY       Current Outpatient Medications  Medication Sig Dispense Refill  . amiodarone (PACERONE) 200 MG tablet Take 200 mg by mouth 2 (two) times daily.    Marland Kitchen. apixaban (ELIQUIS) 5 MG TABS tablet Take 5 mg by mouth 2 (two) times daily.    Marland Kitchen. diltiazem (CARDIZEM CD) 240 MG 24 hr capsule Take 240 mg by mouth daily.    . fexofenadine (ALLEGRA) 180 MG tablet Take 180 mg by mouth daily.    . furosemide (LASIX) 10 MG/ML solution Take 20 mg by mouth daily.    . hydrOXYzine (  ATARAX/VISTARIL) 25 MG tablet Take 1-2 tablets (25-50 mg total) by mouth every 6 (six) hours as needed for anxiety. 20 tablet 0  . LORazepam (ATIVAN) 2 MG tablet Take 2 mg by mouth every evening.    . metoprolol tartrate (LOPRESSOR) 25 MG tablet Take 12.5 mg by mouth 2 (two) times daily.    . potassium chloride (K-DUR) 10 MEQ tablet Take 10 mEq by mouth 2 (two) times daily.    . sucralfate (CARAFATE) 1 g tablet Take 1 g by mouth 4 (four) times daily.    . valACYclovir (VALTREX) 500 MG tablet Take 500 mg by mouth daily.    Marland Kitchen. venlafaxine (EFFEXOR)  75 MG tablet Take 75 mg by mouth 2 (two) times daily.     No current facility-administered medications for this visit.     Allergies:   Bentyl [dicyclomine hcl]; Celebrex [celecoxib]; Fluoxetine; Motrin [ibuprofen]; Phenobarbital; Prednisone; Sulfa antibiotics; and Tizanidine    Social History:  The patient  reports that she has quit smoking. she has never used smokeless tobacco. She reports that she does not drink alcohol or use drugs.   Family History:  The patient's family history includes Atrial fibrillation in her mother; Heart attack in her father; Heart failure in her father; Hypertension in her mother and sister; Myasthenia gravis in her sister.    ROS:  Please see the history of present illness.   Otherwise, review of systems are positive for none.   All other systems are reviewed and negative.    PHYSICAL EXAM: VS:  BP 130/84 (BP Location: Right Arm)   Pulse 91   Ht 5' (1.524 m)   Wt 126 lb 9.6 oz (57.4 kg)   SpO2 100%   BMI 24.72 kg/m  , BMI Body mass index is 24.72 kg/m. GENERAL:  Well appearing HEENT:  Pupils equal round and reactive, fundi not visualized, oral mucosa unremarkable NECK:  No jugular venous distention, waveform within normal limits, carotid upstroke brisk and symmetric, no bruits, no thyromegaly LUNGS:  Diminishedat the R base.  ear to auscultation bilaterally HEART:  Irregularly irregular.   PMI not displaced or sustained,S1 and S2 within normal limits, no S3, no S4, no clicks, no rubs, no murmurs ABD:  Flat, positive bowel sounds normal in frequency in pitch, no bruits, no rebound, no guarding, no midline pulsatile mass, no hepatomegaly, no splenomegaly EXT:  2 plus pulses throughout, no edema, no cyanosis no clubbing SKIN:  No rashes no nodules NEURO:  Cranial nerves II through XII grossly intact, motor grossly intact throughout PSYCH:  Cognitively intact, oriented to person place and time   EKG:  EKG is ordered today. The ekg ordered today  demonstrates atrial fibrillaiton.  Rate 91 bpm.  Low voltage precordial leads.  Non-specific T wave abnormalities.     Recent Labs:  03/29/2017: ALT 23; B Natriuretic Peptide 410.7; BUN 14; Creatinine, Ser 1.18; Hemoglobin 12.4; Platelets 371; Potassium 3.8; Sodium 132    Lipid Panel No results found for: CHOL, TRIG, HDL, CHOLHDL, VLDL, LDLCALC, LDLDIRECT    Wt Readings from Last 3 Encounters:  03/31/17 126 lb 9.6 oz (57.4 kg)  03/29/17 126 lb (57.2 kg)  02/08/17 133 lb (60.3 kg)      ASSESSMENT AND PLAN:  # Persistent atrial fibrillation:  Nicole Kerr remains in atrial fibrillation.  She feels poorly in atrial fibrillation.  Thus far it seems that her management has been standard of care and reasonable.  Her heart rates are well-controlled.  One of  her main complaints is fatigue.  Her rate control agents could be contributing.  Given that her heart rate is 91, both metoprolol and diltiazem should be continued at the current dose.  Agree with attempting DCCV, as this has not occurred since her ablation.  Continue amiodarone.  We will schedule for her to have a consultation with our EP division.  Continue Eilquis.    # Hypertension: Blood pressure well-controlled on metoprolol, diltiazem and furosemide.    Current medicines are reviewed at length with the patient today.  The patient does not have concerns regarding medicines.  The following changes have been made:  no change  Labs/ tests ordered today include:   Orders Placed This Encounter  Procedures  . Ambulatory referral to Cardiac Electrophysiology  . EKG 12-Lead     Disposition:   FU with Oyinkansola Truax C. Duke Salvia, MD, [facility] as needed.     This note was written with the assistance of speech recognition software.  Please excuse any transcriptional errors.  Signed, Lane Kjos C. Duke Salvia, MD, [facility]  04/04/2017 5:14 PM    [facility]

## 2017-04-04 ENCOUNTER — Encounter: Payer: Self-pay | Admitting: Cardiovascular Disease

## 2017-04-29 ENCOUNTER — Institutional Professional Consult (permissible substitution): Payer: Medicare Other | Admitting: Cardiology

## 2019-09-07 IMAGING — CT CT ANGIO CHEST
2 of 8 series · 19 of 36 positions shown · IV contrast (isovue)
Comparison: None.

CLINICAL DATA: Increase shortness of breath, atrial fibrillation.

EXAM:
CT ANGIOGRAPHY CHEST WITH CONTRAST
TECHNIQUE: Multidetector CT imaging of the chest was performed using the
standard protocol during bolus administration of intravenous
contrast. Multiplanar CT image reconstructions and MIPs were
obtained to evaluate the vascular anatomy.
CONTRAST:  100 cc Isovue 370.

[Series 6: pe coronal mpr · coronal · 0.59mm/px · 1 of 118 slices shown]
[im 59/118  mediastinal]
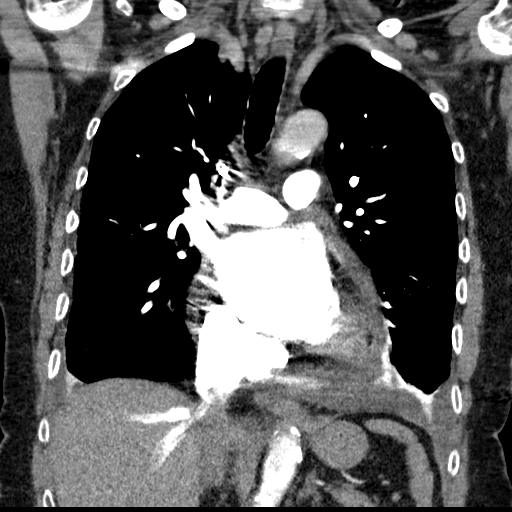

[Series 10: pe thins · axial · 0.61mm/px · z∈[-286,-22]mm · 18 of 296 slices shown]
[im 16/296  lung]
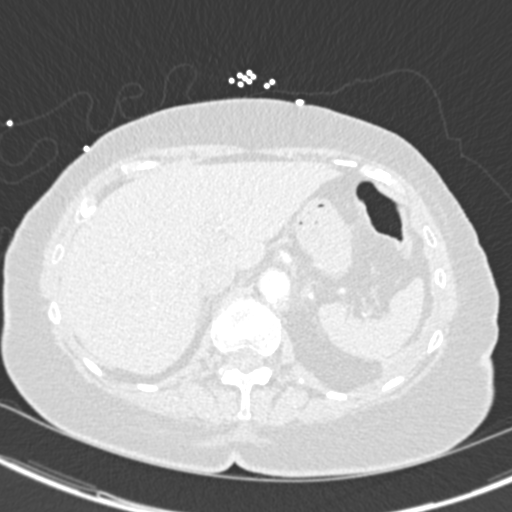
[im 32/296  mediastinal]
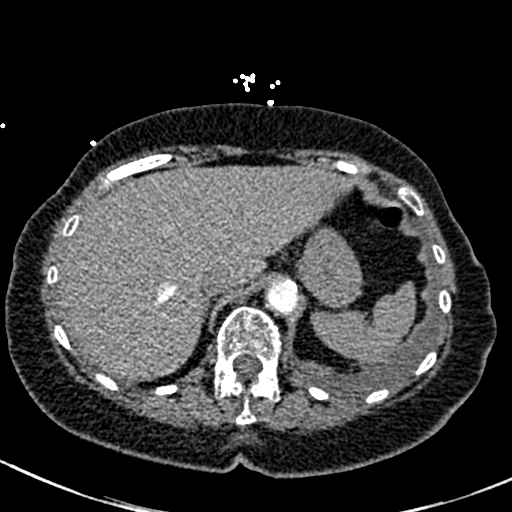
[im 47/296  lung]
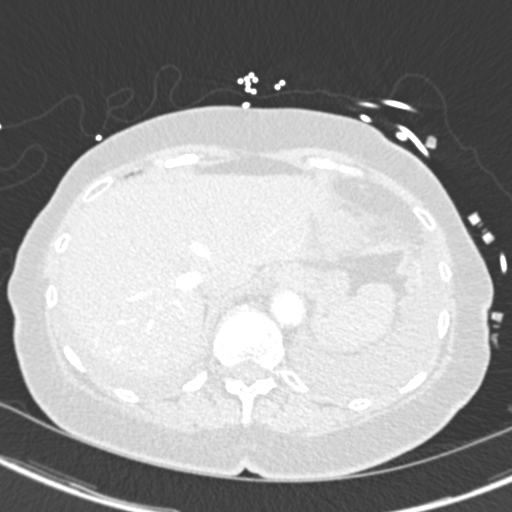
[im 63/296  mediastinal]
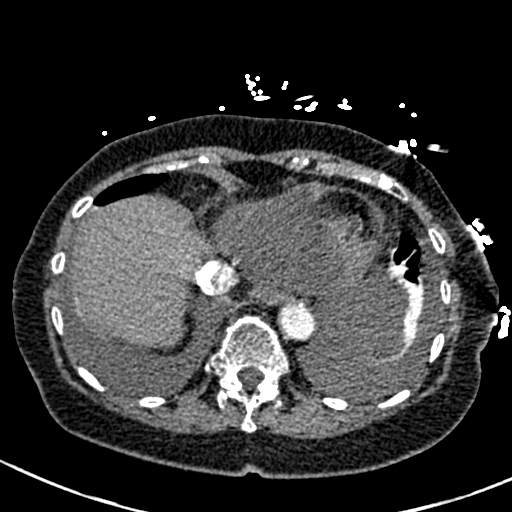
[im 78/296  lung]
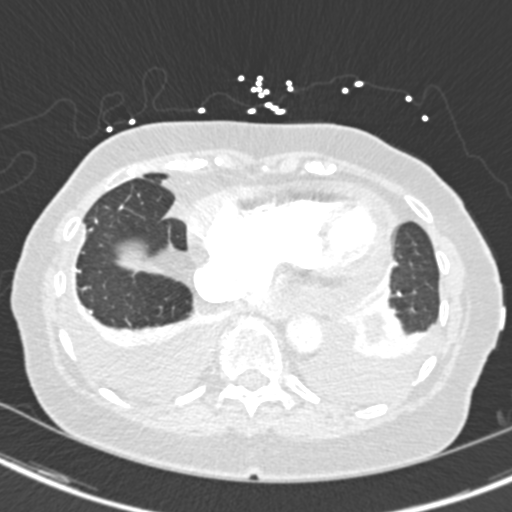
[im 94/296  mediastinal]
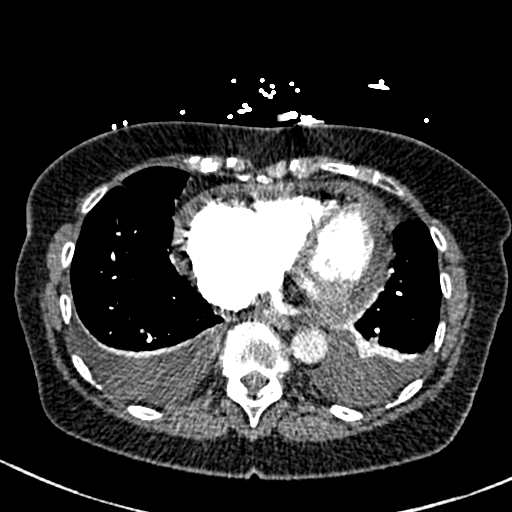
[im 109/296  lung]
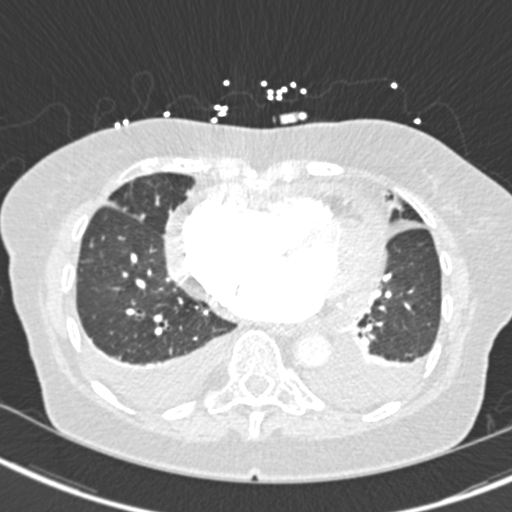
[im 125/296  mediastinal]
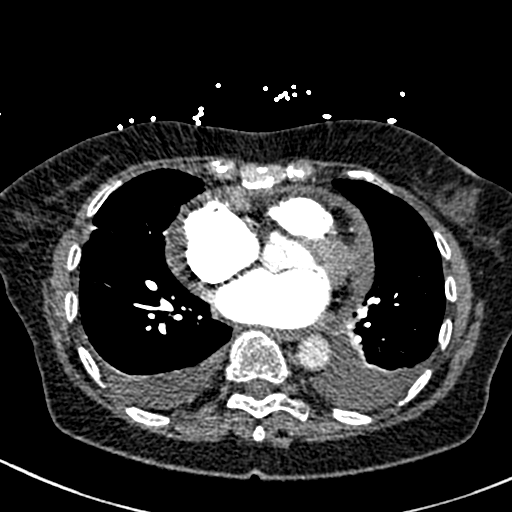
[im 140/296  lung]
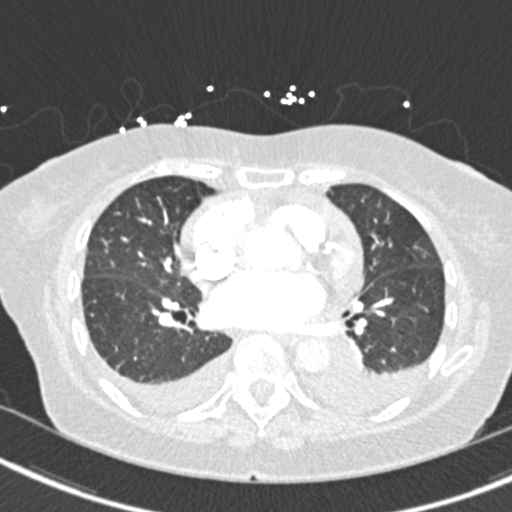
[im 156/296  mediastinal]
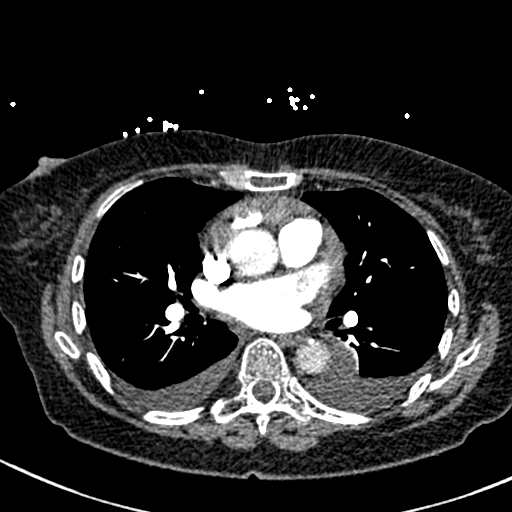
[im 171/296  lung]
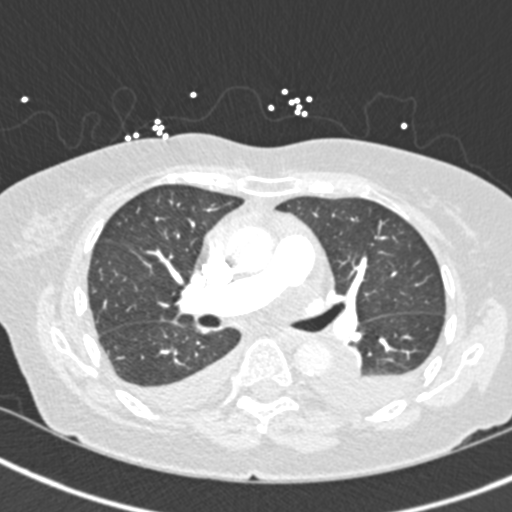
[im 187/296  mediastinal]
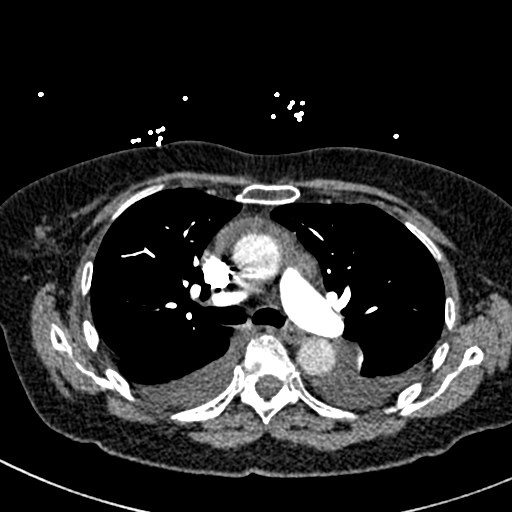
[im 202/296  lung]
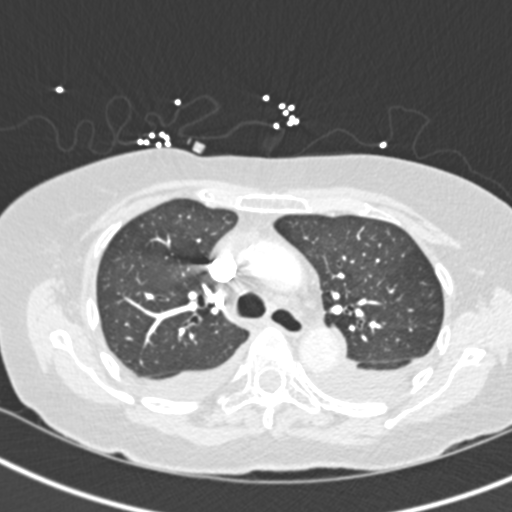
[im 218/296  mediastinal]
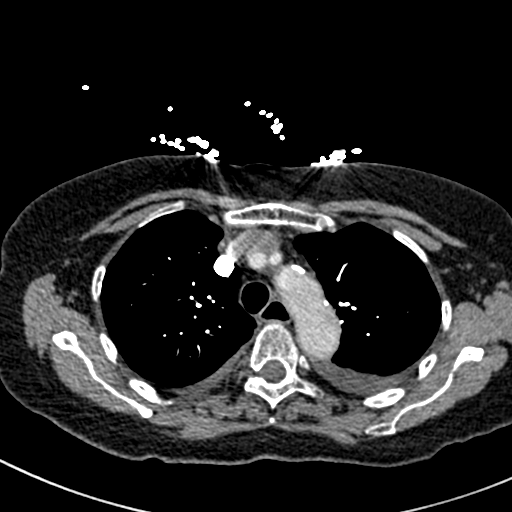
[im 233/296  lung]
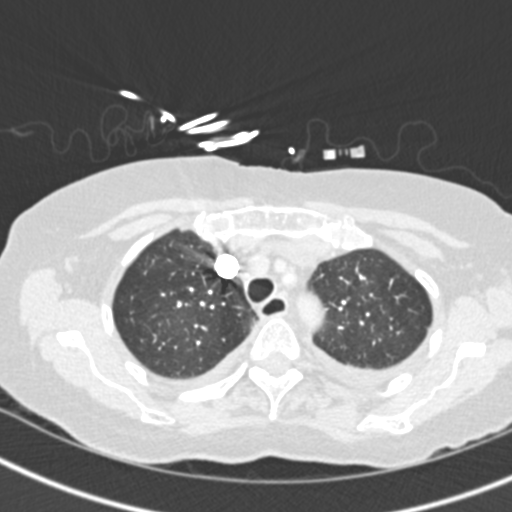
[im 249/296  mediastinal]
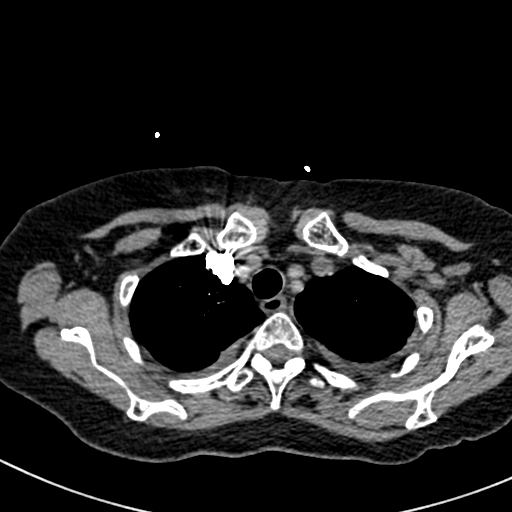
[im 264/296  lung]
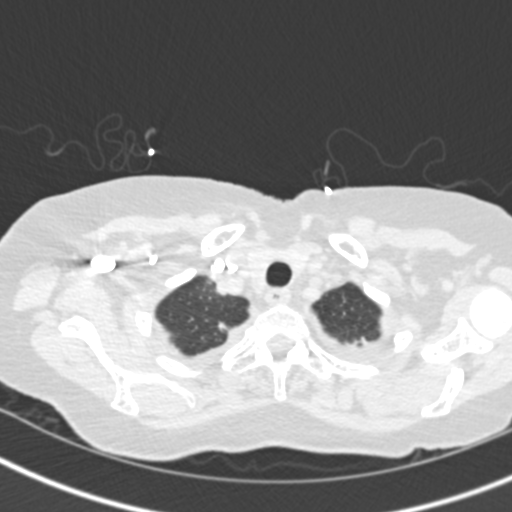
[im 280/296  mediastinal]
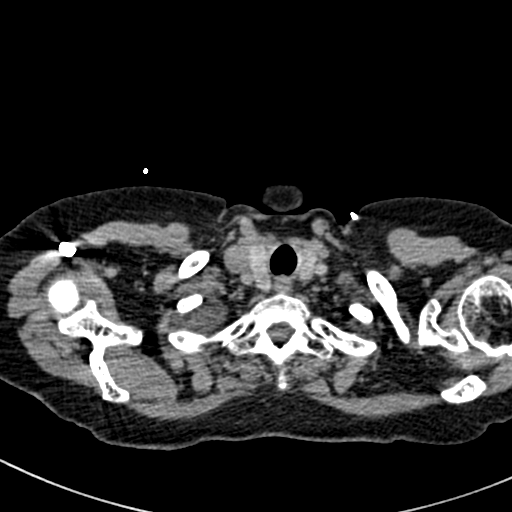

[19 of 36 positions shown; findings below may reference images not displayed]

FINDINGS: Cardiovascular: Negative for pulmonary embolus. Atherosclerotic
calcification of the arterial vasculature, including coronary
arteries. Heart is enlarged. Mild to moderate pericardial effusion.

Mediastinum/Nodes: Mediastinal lymph nodes are not enlarged by CT
size criteria. No hilar or axillary adenopathy. Esophagus is grossly
unremarkable.

Lungs/Pleura: Biapical pleuroparenchymal scarring. Mild volume loss
or scarring in the right middle lobe and lingula. Mild compressive
atelectasis in both lower lobes, adjacent to moderate bilateral
pleural effusions, which are simple in appearance. Airway is
unremarkable.

Upper Abdomen: Reflux of contrast into the IVC and hepatic veins.
Visualized portions of the liver, adrenal glands, left kidney,
spleen, pancreas and stomach are otherwise grossly unremarkable.

Musculoskeletal: No worrisome lytic or sclerotic lesions.
Degenerative changes in the spine.

Review of the MIP images confirms the above findings.
IMPRESSION: 1. Negative for pulmonary embolus.
2. Small to moderate pericardial effusion.
3. Moderate bilateral pleural effusions with mild compressive
atelectasis in both lower lobes.
4. Aortic atherosclerosis (NDDYG-170.0). Coronary artery
calcification.
